# Patient Record
Sex: Male | Born: 1960
Health system: Southern US, Community
[De-identification: ages and names within clinical notes are randomized; demographics above are authoritative.]

## PROBLEM LIST (undated history)

## (undated) HISTORY — PX: NO PAST SURGERIES: SHX2092

---

## 2007-06-06 DIAGNOSIS — R972 Elevated prostate specific antigen [PSA]: Secondary | ICD-10-CM | POA: Insufficient documentation

## 2011-04-07 ENCOUNTER — Ambulatory Visit: Payer: Self-pay | Admitting: Family Medicine

## 2013-01-30 ENCOUNTER — Ambulatory Visit: Payer: Self-pay | Admitting: Gastroenterology

## 2013-01-30 LAB — HM COLONOSCOPY

## 2013-04-22 ENCOUNTER — Ambulatory Visit: Payer: Self-pay | Admitting: Urology

## 2015-01-21 ENCOUNTER — Encounter: Payer: Self-pay | Admitting: Family Medicine

## 2015-05-19 ENCOUNTER — Encounter: Payer: Self-pay | Admitting: Family Medicine

## 2015-10-29 ENCOUNTER — Encounter: Payer: Self-pay | Admitting: Family Medicine

## 2015-11-01 ENCOUNTER — Encounter: Payer: Self-pay | Admitting: Family Medicine

## 2016-10-24 LAB — CBC AND DIFFERENTIAL
HEMATOCRIT: 46 % (ref 41–53)
Hemoglobin: 15.3 g/dL (ref 13.5–17.5)
Neutrophils Absolute: 47 /uL
PLATELETS: 202 10*3/uL (ref 150–399)
WBC: 3.3 10^3/mL

## 2016-10-24 LAB — LIPID PANEL
CHOLESTEROL: 175 mg/dL (ref 0–200)
HDL: 44 mg/dL (ref 35–70)
LDL CALC: 112 mg/dL
Triglycerides: 96 mg/dL (ref 40–160)

## 2016-10-24 LAB — BASIC METABOLIC PANEL
BUN: 14 mg/dL (ref 4–21)
Glucose: 81 mg/dL
SODIUM: 139 mmol/L (ref 137–147)

## 2016-10-24 LAB — TSH: TSH: 5.3 u[IU]/mL (ref 0.41–5.90)

## 2016-10-24 LAB — HEPATIC FUNCTION PANEL
ALT: 21 U/L (ref 10–40)
AST: 33 U/L (ref 14–40)
Alkaline Phosphatase: 70 U/L (ref 25–125)
Bilirubin, Total: 0.4 mg/dL

## 2016-10-24 LAB — VITAMIN D 25 HYDROXY (VIT D DEFICIENCY, FRACTURES): VIT D 25 HYDROXY: 36.8

## 2016-10-24 LAB — PSA: PSA: 3.3

## 2016-10-24 LAB — HEMOGLOBIN A1C: Hemoglobin A1C: 5.6

## 2016-10-31 ENCOUNTER — Other Ambulatory Visit: Payer: Self-pay

## 2016-10-31 DIAGNOSIS — E291 Testicular hypofunction: Secondary | ICD-10-CM | POA: Insufficient documentation

## 2016-10-31 DIAGNOSIS — N419 Inflammatory disease of prostate, unspecified: Secondary | ICD-10-CM | POA: Insufficient documentation

## 2016-10-31 DIAGNOSIS — N529 Male erectile dysfunction, unspecified: Secondary | ICD-10-CM | POA: Insufficient documentation

## 2016-10-31 DIAGNOSIS — E559 Vitamin D deficiency, unspecified: Secondary | ICD-10-CM | POA: Insufficient documentation

## 2016-10-31 DIAGNOSIS — E785 Hyperlipidemia, unspecified: Secondary | ICD-10-CM | POA: Insufficient documentation

## 2016-10-31 DIAGNOSIS — R7309 Other abnormal glucose: Secondary | ICD-10-CM | POA: Insufficient documentation

## 2016-11-06 ENCOUNTER — Encounter: Payer: Self-pay | Admitting: Family Medicine

## 2016-11-06 ENCOUNTER — Ambulatory Visit (INDEPENDENT_AMBULATORY_CARE_PROVIDER_SITE_OTHER): Payer: BLUE CROSS/BLUE SHIELD | Admitting: Family Medicine

## 2016-11-06 VITALS — BP 130/72 | HR 56 | Temp 98.1°F | Ht 72.0 in | Wt 181.0 lb

## 2016-11-06 DIAGNOSIS — E559 Vitamin D deficiency, unspecified: Secondary | ICD-10-CM

## 2016-11-06 DIAGNOSIS — Z0000 Encounter for general adult medical examination without abnormal findings: Secondary | ICD-10-CM | POA: Diagnosis not present

## 2016-11-06 NOTE — Progress Notes (Signed)
Patient: Christopher Wojtaszek., Male    DOB: 10-29-1960, 56 y.o.   MRN: [id number] Visit Date: 11/06/2016  Today's Provider: Dortha Kern, PA   Chief Complaint  Patient presents with  . Annual Exam   Subjective:    Annual physical exam Christopher Cuevas is a 56 y.o. male who presents today for health maintenance and complete physical. He feels well. He reports exercising doing yard work cutting trees and mulching. He reports he is sleeping fair.  ----------------------------------------------------------------- Tdap: 2011 per patient he will get his employer to fax confirmation to [place] Colonoscopy: 01/30/2013  Review of Systems  Constitutional: Negative.   HENT: Negative.   Eyes: Negative.   Respiratory: Negative.   Cardiovascular: Negative.   Gastrointestinal: Negative.   Endocrine: Negative.   Genitourinary: Negative.   Musculoskeletal: Negative.   Skin: Negative.   Allergic/Immunologic: Negative.   Neurological: Negative.   Hematological: Negative.   Psychiatric/Behavioral: Negative.     Social History      He  reports that he has never smoked. He has never used smokeless tobacco. He reports that he drinks alcohol. He reports that he does not use drugs.       Social History   Social History  . Marital status: Divorced since 2008    Spouse name: N/A  . Number of children: N/A  . Years of education: N/A   Social History Main Topics  . Smoking status: Never Smoker  . Smokeless tobacco: Never Used  . Alcohol use Yes     Comment: 1-2 glasses of wine per 2 weeks  . Drug use: No  . Sexual activity: Not Asked   Other Topics Concern  . None   Social History Narrative  . None    No past medical history on file.   Patient Active Problem List   Diagnosis Date Noted  . Abnormal glucose level 10/31/2016  . ED (erectile dysfunction) of organic origin 10/31/2016  . HLD (hyperlipidemia) 10/31/2016  . Male hypogonadism 10/31/2016  . Prostatitis  10/31/2016  . Avitaminosis D 10/31/2016  . Elevated prostate specific antigen (PSA) 06/06/2007    Past Surgical History:  Procedure Laterality Date  . NO PAST SURGERIES     Family History        Family Status  Relation Status  . Mother Deceased at age 49  . Father Deceased at age 57  . Sister Alive  . Brother Alive  . Maternal Grandmother Deceased  . Maternal Grandfather Deceased  . Paternal Grandmother Deceased  . Sister Alive  . Sister Alive  . Sister Alive        His family history includes Asthma in his mother; Stomach cancer in his maternal grandmother.     No Known Allergies   Current Outpatient Prescriptions:  .  Calcium Polycarbophil (FIBER-CAPS PO), Take by mouth., Disp: , Rfl:  .  Cholecalciferol (VITAMIN D3) 1000 units CAPS, Take by mouth., Disp: , Rfl:  .  Omega-3 Fatty Acids (FISH OIL) 1200 MG CAPS, Take by mouth., Disp: , Rfl:    Patient Care Team: Tamsen Roers, PA as PCP - General (Family Medicine)      Objective:   Vitals: BP 130/72 (BP Location: Right Arm, Patient Position: Sitting, Cuff Size: Normal)   Pulse (!) 56   Temp 98.1 F (36.7 C) (Oral)   Ht 6' (1.829 m)   Wt 181 lb (82.1 kg)   BMI 24.55 kg/m    Vitals:  11/06/16 0848  BP: 130/72  Pulse: (!) 56  Temp: 98.1 F (36.7 C)  TempSrc: Oral  Weight: 181 lb (82.1 kg)  Height: 6' (1.829 m)    Physical Exam  Constitutional: He is oriented to person, place, and time. He appears well-developed and well-nourished.  HENT:  Head: Normocephalic and atraumatic.  Right Ear: External ear normal.  Left Ear: External ear normal.  Nose: Nose normal.  Mouth/Throat: Oropharynx is clear and moist.  Eyes: Conjunctivae and EOM are normal. Pupils are equal, round, and reactive to light. Right eye exhibits no discharge.  Neck: Normal range of motion. Neck supple. No tracheal deviation present. No thyromegaly present.  Cardiovascular: Normal rate, regular rhythm, normal heart sounds and intact  distal pulses.   No murmur heard. Pulmonary/Chest: Effort normal and breath sounds normal. No respiratory distress. He has no wheezes. He has no rales. He exhibits no tenderness.  Abdominal: Soft. He exhibits no distension and no mass. There is no tenderness. There is no rebound and no guarding.  Genitourinary: Prostate normal and penis normal. Rectal exam shows guaiac negative stool.  Musculoskeletal: Normal range of motion. He exhibits no edema or tenderness.  Lymphadenopathy:    He has no cervical adenopathy.  Neurological: He is alert and oriented to person, place, and time. He has normal reflexes. No cranial nerve deficit. He exhibits normal muscle tone. Coordination normal.  Skin: Skin is warm and dry. No rash noted. No erythema.  Psychiatric: He has a normal mood and affect. His behavior is normal. Judgment and thought content normal.    Depression Screen PHQ 2/9 Scores 11/06/2016  PHQ - 2 Score 0  PHQ- 9 Score 0   Assessment & Plan:     Routine Health Maintenance and Physical Exam  Exercise Activities and Dietary recommendations Goals    None      Immunization History  Administered Date(s) Administered  . Zoster 11/14/2012    Health Maintenance  Topic Date Due  . Hepatitis C Screening  February 10, 1961  . HIV Screening  11/05/1977  . TETANUS/TDAP  11/05/1981  . INFLUENZA VACCINE  02/14/2017  . COLONOSCOPY  01/31/2023     Discussed health benefits of physical activity, and encouraged him to engage in regular exercise appropriate for his age and condition.    --------------------------------------------------------------------  1. Annual physical exam Good general health. States last tetanus booster was in 2011 and had colonoscopy in 01-30-13. Old records in [place] showed Zostavax was given 11-14-12. Labs from 10-24-16 showed all tests normal with PSA 3.3. Given anticipatory guidance and he will request copy of last tetanus booster record. States he has had Hep C and HIV  blood tests. Will get report from place of employment.   2. Avitaminosis D Vitamin-D level was 36.8 on 10-24-16. Continues to take 1000 IU qd.   Dortha Kern, PA  [facility]

## 2016-11-06 NOTE — Patient Instructions (Signed)
 Health Maintenance, Male A healthy lifestyle and preventive care is important for your health and wellness. Ask your health care provider about what schedule of regular examinations is right for you. What should I know about weight and diet?  Eat a Healthy Diet  Eat plenty of vegetables, fruits, whole grains, low-fat dairy products, and lean protein.  Do not eat a lot of foods high in solid fats, added sugars, or salt. Maintain a Healthy Weight  Regular exercise can help you achieve or maintain a healthy weight. You should:  Do at least 150 minutes of exercise each week. The exercise should increase your heart rate and make you sweat (moderate-intensity exercise).  Do strength-training exercises at least twice a week. Watch Your Levels of Cholesterol and Blood Lipids  Have your blood tested for lipids and cholesterol every 5 years starting at 54 years of age. If you are at high risk for heart disease, you should start having your blood tested when you are 54 years old. You may need to have your cholesterol levels checked more often if:  Your lipid or cholesterol levels are high.  You are older than 54 years of age.  You are at high risk for heart disease. What should I know about cancer screening? Many types of cancers can be detected early and may often be prevented. Lung Cancer  You should be screened every year for lung cancer if:  You are a current smoker who has smoked for at least 30 years.  You are a former smoker who has quit within the past 15 years.  Talk to your health care provider about your screening options, when you should start screening, and how often you should be screened. Colorectal Cancer  Routine colorectal cancer screening usually begins at 54 years of age and should be repeated every 5-10 years until you are 54 years old. You may need to be screened more often if early forms of precancerous polyps or small growths are found. Your health care provider  may recommend screening at an earlier age if you have risk factors for colon cancer.  Your health care provider may recommend using home test kits to check for hidden blood in the stool.  A small camera at the end of a tube can be used to examine your colon (sigmoidoscopy or colonoscopy). This checks for the earliest forms of colorectal cancer. Prostate and Testicular Cancer  Depending on your age and overall health, your health care provider may do certain tests to screen for prostate and testicular cancer.  Talk to your health care provider about any symptoms or concerns you have about testicular or prostate cancer. Skin Cancer  Check your skin from head to toe regularly.  Tell your health care provider about any new moles or changes in moles, especially if:  There is a change in a mole's size, shape, or color.  You have a mole that is larger than a pencil eraser.  Always use sunscreen. Apply sunscreen liberally and repeat throughout the day.  Protect yourself by wearing long sleeves, pants, a wide-brimmed hat, and sunglasses when outside. What should I know about heart disease, diabetes, and high blood pressure?  If you are 18-39 years of age, have your blood pressure checked every 3-5 years. If you are 40 years of age or older, have your blood pressure checked every year. You should have your blood pressure measured twice-once when you are at a hospital or clinic, and once when you are not at   a hospital or clinic. Record the average of the two measurements. To check your blood pressure when you are not at a hospital or clinic, you can use:  An automated blood pressure machine at a pharmacy.  A home blood pressure monitor.  Talk to your health care provider about your target blood pressure.  If you are between 45-79 years old, ask your health care provider if you should take aspirin to prevent heart disease.  Have regular diabetes screenings by checking your fasting blood sugar  level.  If you are at a normal weight and have a low risk for diabetes, have this test once every three years after the age of 45.  If you are overweight and have a high risk for diabetes, consider being tested at a younger age or more often.  A one-time screening for abdominal aortic aneurysm (AAA) by ultrasound is recommended for men aged 65-75 years who are current or former smokers. What should I know about preventing infection? Hepatitis B  If you have a higher risk for hepatitis B, you should be screened for this virus. Talk with your health care provider to find out if you are at risk for hepatitis B infection. Hepatitis C  Blood testing is recommended for:  Everyone born from 1945 through 1965.  Anyone with known risk factors for hepatitis C. Sexually Transmitted Diseases (STDs)  You should be screened each year for STDs including gonorrhea and chlamydia if:  You are sexually active and are younger than 54 years of age.  You are older than 54 years of age and your health care provider tells you that you are at risk for this type of infection.  Your sexual activity has changed since you were last screened and you are at an increased risk for chlamydia or gonorrhea. Ask your health care provider if you are at risk.  Talk with your health care provider about whether you are at high risk of being infected with HIV. Your health care provider may recommend a prescription medicine to help prevent HIV infection. What else can I do?  Schedule regular health, dental, and eye exams.  Stay current with your vaccines (immunizations).  Do not use any tobacco products, such as cigarettes, chewing tobacco, and e-cigarettes. If you need help quitting, ask your health care provider.  Limit alcohol intake to no more than 2 drinks per day. One drink equals 12 ounces of beer, 5 ounces of wine, or 1 ounces of hard liquor.  Do not use street drugs.  Do not share needles.  Ask your health  care provider for help if you need support or information about quitting drugs.  Tell your health care provider if you often feel depressed.  Tell your health care provider if you have ever been abused or do not feel safe at home. This information is not intended to replace advice given to you by your health care provider. Make sure you discuss any questions you have with your health care provider. Document Released: 12/30/2007 Document Revised: 03/01/2016 Document Reviewed: 04/06/2015 Elsevier Interactive Patient Education  2017 [employer]  

## 2016-11-08 ENCOUNTER — Encounter: Payer: Self-pay | Admitting: Family Medicine

## 2017-10-30 LAB — LIPID PANEL
CHOLESTEROL: 206 — AB (ref 0–200)
HDL: 49 (ref 35–70)
LDL Cholesterol: 141
Triglycerides: 82 (ref 40–160)

## 2017-10-30 LAB — CBC AND DIFFERENTIAL
HCT: 45 (ref 41–53)
Hemoglobin: 15.4 (ref 13.5–17.5)
NEUTROS ABS: 2
PLATELETS: 207 (ref 150–399)
WBC: 3.6

## 2017-10-30 LAB — HEPATIC FUNCTION PANEL
ALT: 24 (ref 10–40)
AST: 30 (ref 14–40)
Alkaline Phosphatase: 71 (ref 25–125)
Bilirubin, Total: 0.2

## 2017-10-30 LAB — BASIC METABOLIC PANEL
BUN: 11 (ref 4–21)
Creatinine: 1.1 (ref 0.6–1.3)
Glucose: 94
POTASSIUM: 4.5 (ref 3.4–5.3)
Sodium: 144 (ref 137–147)

## 2017-10-30 LAB — PSA: PSA: 4.7

## 2017-10-30 LAB — VITAMIN D 25 HYDROXY (VIT D DEFICIENCY, FRACTURES): VIT D 25 HYDROXY: 31.5

## 2017-10-30 LAB — TSH: TSH: 4.8 (ref 0.41–5.90)

## 2017-10-30 LAB — IRON,TIBC AND FERRITIN PANEL: IRON: 67

## 2017-10-30 LAB — HEMOGLOBIN A1C: Hemoglobin A1C: 5.8

## 2017-11-09 ENCOUNTER — Encounter: Payer: Self-pay | Admitting: Family Medicine

## 2017-11-09 ENCOUNTER — Ambulatory Visit (INDEPENDENT_AMBULATORY_CARE_PROVIDER_SITE_OTHER): Payer: BLUE CROSS/BLUE SHIELD | Admitting: Family Medicine

## 2017-11-09 VITALS — BP 118/68 | HR 69 | Temp 98.9°F | Ht 72.0 in | Wt 179.4 lb

## 2017-11-09 DIAGNOSIS — E7800 Pure hypercholesterolemia, unspecified: Secondary | ICD-10-CM | POA: Diagnosis not present

## 2017-11-09 DIAGNOSIS — Z0000 Encounter for general adult medical examination without abnormal findings: Secondary | ICD-10-CM

## 2017-11-09 DIAGNOSIS — Z1159 Encounter for screening for other viral diseases: Secondary | ICD-10-CM

## 2017-11-09 DIAGNOSIS — Z114 Encounter for screening for human immunodeficiency virus [HIV]: Secondary | ICD-10-CM | POA: Diagnosis not present

## 2017-11-09 NOTE — Progress Notes (Signed)
Patient: Christopher Cuevas., Male    DOB: 08/13/1960, 57 y.o.   MRN: [id number] Visit Date: 11/09/2017  Today's Provider: Dortha Kern, PA   Chief Complaint  Patient presents with  . Annual Exam   Subjective:    Annual physical exam [name]. is a 57 y.o. male who presents today for health maintenance and complete physical. He feels well. He reports not exercising regularly, just yard work and strenuous work at job. He reports he is sleeping well.  -----------------------------------------------------------------   Review of Systems  Constitutional: Negative.   HENT: Negative.   Eyes: Negative.   Respiratory: Negative.   Cardiovascular: Negative.   Gastrointestinal: Negative.   Endocrine: Negative.   Genitourinary: Negative.   Musculoskeletal: Negative.   Skin: Negative.   Allergic/Immunologic: Negative.   Neurological: Negative.   Hematological: Negative.   Psychiatric/Behavioral: Negative.     Social History      He  reports that he has never smoked. He has never used smokeless tobacco. He reports that he drinks alcohol. He reports that he does not use drugs.       Social History   Socioeconomic History  . Marital status: Single    Spouse name: Not on file  . Number of children: Not on file  . Years of education: Not on file  . Highest education level: Not on file  Occupational History  . Not on file  Social Needs  . Financial resource strain: Not on file  . Food insecurity:    Worry: Not on file    Inability: Not on file  . Transportation needs:    Medical: Not on file    Non-medical: Not on file  Tobacco Use  . Smoking status: Never Smoker  . Smokeless tobacco: Never Used  Substance and Sexual Activity  . Alcohol use: Yes    Comment: 1-2 glasses of wine per 2 weeks  . Drug use: No  . Sexual activity: Not on file  Lifestyle  . Physical activity:    Days per week: Not on file    Minutes per session: Not on file  . Stress:  Not on file  Relationships  . Social connections:    Talks on phone: Not on file    Gets together: Not on file    Attends religious service: Not on file    Active member of club or organization: Not on file    Attends meetings of clubs or organizations: Not on file    Relationship status: Not on file  Other Topics Concern  . Not on file  Social History Narrative  . Not on file   Patient Active Problem List   Diagnosis Date Noted  . Abnormal glucose level 10/31/2016  . ED (erectile dysfunction) of organic origin 10/31/2016  . HLD (hyperlipidemia) 10/31/2016  . Male hypogonadism 10/31/2016  . Prostatitis 10/31/2016  . Avitaminosis D 10/31/2016  . Elevated prostate specific antigen (PSA) 06/06/2007   Past Surgical History:  Procedure Laterality Date  . NO PAST SURGERIES     Family History        Family Status  Relation Name Status  . Mother  Deceased at age 6  . Father  Deceased at age 37  . Sister  Alive  . Brother  Alive  . MGM  Deceased  . MGF  Deceased  . PGM  Deceased  . Sister  Alive  . Sister  Alive  . Sister  Alive  His family history includes Asthma in his mother; Stomach cancer in his maternal grandmother.     No Known Allergies  Current Outpatient Medications:  .  Calcium Polycarbophil (FIBER-CAPS PO), Take by mouth., Disp: , Rfl:  .  Cholecalciferol (VITAMIN D3) 1000 units CAPS, Take by mouth., Disp: , Rfl:  .  Omega-3 Fatty Acids (FISH OIL) 1200 MG CAPS, Take by mouth., Disp: , Rfl:  .  OVER THE COUNTER MEDICATION, , Disp: , Rfl:  .  Red Yeast Rice 600 MG CAPS, Take 2 capsules by mouth daily., Disp: , Rfl:    Patient Care Team: Ravin Bendall, Jodell Cipro, PA as PCP - General (Family Medicine)     Objective:   Vitals: BP 118/68 (BP Location: Right Arm, Patient Position: Sitting, Cuff Size: Normal)   Pulse 69   Temp 98.9 F (37.2 C) (Oral)   Ht 6' (1.829 m)   Wt 179 lb 6.4 oz (81.4 kg)   SpO2 99%   BMI 24.33 kg/m  Wt Readings from Last 3  Encounters:  11/09/17 179 lb 6.4 oz (81.4 kg)  11/06/16 181 lb (82.1 kg)    Physical Exam  Constitutional: He is oriented to person, place, and time. He appears well-developed and well-nourished.  HENT:  Head: Normocephalic and atraumatic.  Right Ear: External ear normal.  Left Ear: External ear normal.  Nose: Nose normal.  Mouth/Throat: Oropharynx is clear and moist.  Eyes: Pupils are equal, round, and reactive to light. Conjunctivae and EOM are normal. Right eye exhibits no discharge.  Neck: Normal range of motion. Neck supple. No tracheal deviation present. No thyromegaly present.  Cardiovascular: Normal rate, regular rhythm, normal heart sounds and intact distal pulses.  No murmur heard. Pulmonary/Chest: Effort normal and breath sounds normal. No respiratory distress. He has no wheezes. He has no rales. He exhibits no tenderness.  Abdominal: Soft. He exhibits no distension and no mass. There is no tenderness. There is no rebound and no guarding.  Genitourinary: Rectum normal, prostate normal and penis normal. Rectal exam shows guaiac negative stool.  Musculoskeletal: Normal range of motion. He exhibits no edema or tenderness.  Lymphadenopathy:    He has no cervical adenopathy.  Neurological: He is alert and oriented to person, place, and time. He has normal reflexes. He displays normal reflexes. No cranial nerve deficit. He exhibits normal muscle tone. Coordination normal.  Skin: Skin is warm and dry. No rash noted. No erythema.  Psychiatric: He has a normal mood and affect. His behavior is normal. Judgment and thought content normal.    Depression Screen PHQ 2/9 Scores 11/09/2017 11/06/2016  PHQ - 2 Score 0 0  PHQ- 9 Score 0 0    Assessment & Plan:     Routine Health Maintenance and Physical Exam  Exercise Activities and Dietary recommendations Goals    Recommend exercise for 30 minutes 3-4 days a week (job is very physical).      Immunization History  Administered  Date(s) Administered  . Tdap 10/20/2015  . Zoster 11/14/2012    Health Maintenance  Topic Date Due  . Hepatitis C Screening  [birth year]  . HIV Screening  11/05/1977  . INFLUENZA VACCINE  02/14/2018  . COLONOSCOPY  01/31/2023  . TETANUS/TDAP  10/19/2025    Discussed health benefits of physical activity, and encouraged him to engage in regular exercise appropriate for his age and condition.    -------------------------------------------------------------------- 1. Annual physical exam Good general health. Immunizations up to date and has had a normal colonoscopy  in 2014. Labs done at work shows some elevation of LDL and borderline PSA. Denies frequency, nocturia or hesitancy of urine flow. Encouraged to exercise regularly. Given anticipatory guidance. Recheck annually. Recent Results (from the past 2160 hour(s))  Iron, TIBC and Ferritin Panel     Status: None   Collection Time: 10/30/17 12:00 AM  Result Value Ref Range   Iron 67   CBC and differential     Status: None   Collection Time: 10/30/17 12:00 AM  Result Value Ref Range   Hemoglobin 15.4 13.5 - 17.5   HCT 45 41 - 53   Neutrophils Absolute 2    Platelets 207 150 - 399   WBC 3.6   VITAMIN D 25 Hydroxy (Vit-D Deficiency, Fractures)     Status: None   Collection Time: 10/30/17 12:00 AM  Result Value Ref Range   Vit D, 25-Hydroxy 31.5   Basic metabolic panel     Status: None   Collection Time: 10/30/17 12:00 AM  Result Value Ref Range   Glucose 94    BUN 11 4 - 21   Creatinine 1.1 0.6 - 1.3   Potassium 4.5 3.4 - 5.3   Sodium 144 137 - 147  Lipid panel     Status: Abnormal   Collection Time: 10/30/17 12:00 AM  Result Value Ref Range   Triglycerides 82 40 - 160   Cholesterol 206 (A) 0 - 200   HDL 49 35 - 70   LDL Cholesterol 141   Hepatic function panel     Status: None   Collection Time: 10/30/17 12:00 AM  Result Value Ref Range   Alkaline Phosphatase 71 25 - 125   ALT 24 10 - 40   AST 30 14 - 40   Bilirubin,  Total 0.2   Hemoglobin A1c     Status: None   Collection Time: 10/30/17 12:00 AM  Result Value Ref Range   Hemoglobin A1C 5.8   PSA     Status: None   Collection Time: 10/30/17 12:00 AM  Result Value Ref Range   PSA 4.7   TSH     Status: None   Collection Time: 10/30/17 12:00 AM  Result Value Ref Range   TSH 4.80 0.41 - 5.90   2. Screening for HIV (human immunodeficiency virus) States this blood test was done at his place of employment. Will get date and test results to scan into chart.  3. Need for hepatitis C screening test States this blood test was done at his place of employment. Will get date and test results to scan into chart.  4. Elevated LDL cholesterol level Still taking the Red Yeast Rice 600 mg qd with Omega-3 Fatty Acid 1200 mg qd. Trying to follow a low fat diet. Encouraged to exercise for 30 minutes 3-4 days a week. Lipid Panel     Component Value Date/Time   CHOL 206 (A) 10/30/2017   TRIG 82 10/30/2017   HDL 49 10/30/2017   LDLCALC 141 10/30/2017       Dortha Kernennis Gwendolyne Welford, PA  [facility]

## 2018-01-10 LAB — HM HEPATITIS C SCREENING LAB: HM Hepatitis Screen: NEGATIVE

## 2018-01-10 LAB — HM HIV SCREENING LAB: HM HIV Screening: NEGATIVE

## 2019-04-18 LAB — LIPID PANEL
Chol/HDL Ratio: 5.2
Cholesterol: 224 — AB (ref 0–200)
Estimated CHD Risk: 1.1
HDL: 43 (ref 35–70)
LDL Cholesterol: 159
Triglycerides: 119 (ref 40–160)
VLDL Cholesterol Cal: 22

## 2019-04-18 LAB — CMP12+LP+TP+TSH+6AC+PSA+CBC?
Albumin: 4.5
BUN/Creatinine Ratio: 10
EGFR (Non-African Amer.): 71
GGT: 55
Phosphorus: 3.2
Total Protein: 7 g/dL

## 2019-04-18 LAB — CBC WITH DIFFERENTIAL/PLATELET
BASO%: 2 %
BASO(ABSOLUTE): 0.1
EOS (ABSOLUTE): 0.2
EOS%: 6 %
Grans (Absolute): 0
Grans: 1
LYMPH%: 32 %
Lymphs Abs: 1.1
MCH: 31.7 pg (ref 26.5–32.5)
MCHC: 35.2 g/dL (ref 32.5–36.9)
MCV: 90 fL (ref 76.0–111.0)
Monocyes absolute: 0.3 10*3/uL (ref 0.1–1)
Monocyte %: 10
Neutro Abs: 1.7
Neutrophil %: 49
Platelet: 198
RBC: 5.05 (ref 3.87–5.11)
RDW: 12.4 % (ref 11.6–14)

## 2019-04-18 LAB — HEPATIC FUNCTION PANEL
ALT: 35 (ref 10–40)
AST: 42 — AB (ref 14–40)
Alkaline Phosphatase: 78 (ref 25–125)
Bilirubin, Total: 0.4

## 2019-04-18 LAB — CMP12+LP+TP+TSH+6AC+PSA+CBC…
Albumin/Globulin Ratio: 1.8
Calcium: 9.7
Chloride, Serum: 104
EGFR (African American): 82
Globulin, Total: 2.5
LDH: 222
Uric Acid: 8.3

## 2019-04-18 LAB — CBC AND DIFFERENTIAL
HCT: 45 (ref 41–53)
Hemoglobin: 16 (ref 13.5–17.5)
WBC: 3.4

## 2019-04-18 LAB — BASIC METABOLIC PANEL
BUN: 11 (ref 4–21)
Creatinine: 1.2 (ref 0.6–1.3)
Glucose: 99
Potassium: 4.6 (ref 3.4–5.3)
Sodium: 140 (ref 137–147)

## 2019-04-18 LAB — TSH
Free Thyroxine Index: 1.4
T3 Uptake: 27
T4: 5.2
TSH: 2.58 (ref 0.41–5.90)

## 2019-04-18 LAB — VITAMIN D 25 HYDROXY (VIT D DEFICIENCY, FRACTURES): Vit D, 25-Hydroxy: 29.1

## 2019-04-18 LAB — PSA: PSA: 3.3

## 2019-04-18 LAB — IRON,TIBC AND FERRITIN PANEL: Iron: 106

## 2019-04-18 LAB — HEMOGLOBIN A1C: Hemoglobin A1C: 5.6

## 2019-05-07 ENCOUNTER — Encounter: Payer: Self-pay | Admitting: *Deleted

## 2019-11-25 DIAGNOSIS — Z0189 Encounter for other specified special examinations: Secondary | ICD-10-CM | POA: Diagnosis not present

## 2019-11-27 DIAGNOSIS — Z0131 Encounter for examination of blood pressure with abnormal findings: Secondary | ICD-10-CM | POA: Diagnosis not present

## 2019-11-27 DIAGNOSIS — Z713 Dietary counseling and surveillance: Secondary | ICD-10-CM | POA: Diagnosis not present

## 2019-11-27 DIAGNOSIS — Z043 Encounter for examination and observation following other accident: Secondary | ICD-10-CM | POA: Diagnosis not present

## 2020-02-18 DIAGNOSIS — Z0189 Encounter for other specified special examinations: Secondary | ICD-10-CM | POA: Diagnosis not present

## 2020-03-01 DIAGNOSIS — R972 Elevated prostate specific antigen [PSA]: Secondary | ICD-10-CM | POA: Diagnosis not present

## 2020-03-02 DIAGNOSIS — R310 Gross hematuria: Secondary | ICD-10-CM | POA: Diagnosis not present

## 2020-03-02 DIAGNOSIS — R361 Hematospermia: Secondary | ICD-10-CM | POA: Diagnosis not present

## 2020-03-02 DIAGNOSIS — N401 Enlarged prostate with lower urinary tract symptoms: Secondary | ICD-10-CM | POA: Diagnosis not present

## 2020-03-02 DIAGNOSIS — Z125 Encounter for screening for malignant neoplasm of prostate: Secondary | ICD-10-CM | POA: Diagnosis not present

## 2020-03-04 ENCOUNTER — Other Ambulatory Visit: Payer: Self-pay | Admitting: Urology

## 2020-03-04 DIAGNOSIS — R310 Gross hematuria: Secondary | ICD-10-CM

## 2020-03-08 ENCOUNTER — Encounter (INDEPENDENT_AMBULATORY_CARE_PROVIDER_SITE_OTHER): Payer: Self-pay

## 2020-03-08 ENCOUNTER — Other Ambulatory Visit: Payer: Self-pay

## 2020-03-08 ENCOUNTER — Ambulatory Visit
Admission: RE | Admit: 2020-03-08 | Discharge: 2020-03-08 | Disposition: A | Discharge status: Home / Self Care | Payer: BC Managed Care – PPO | Source: Ambulatory Visit | Attending: Urology | Admitting: Urology

## 2020-03-08 DIAGNOSIS — R310 Gross hematuria: Secondary | ICD-10-CM | POA: Insufficient documentation

## 2020-03-08 MED ORDER — IOHEXOL 300 MG/ML  SOLN
125.0000 mL | Freq: Once | INTRAMUSCULAR | Status: AC | PRN
  Administered 2020-03-08: 125 mL via INTRAVENOUS

## 2020-03-16 DIAGNOSIS — R972 Elevated prostate specific antigen [PSA]: Secondary | ICD-10-CM | POA: Diagnosis not present

## 2020-03-16 DIAGNOSIS — N401 Enlarged prostate with lower urinary tract symptoms: Secondary | ICD-10-CM | POA: Diagnosis not present

## 2020-03-16 DIAGNOSIS — R361 Hematospermia: Secondary | ICD-10-CM | POA: Diagnosis not present

## 2020-03-16 DIAGNOSIS — R310 Gross hematuria: Secondary | ICD-10-CM | POA: Diagnosis not present

## 2020-03-17 DIAGNOSIS — Z0189 Encounter for other specified special examinations: Secondary | ICD-10-CM | POA: Diagnosis not present

## 2020-03-18 ENCOUNTER — Other Ambulatory Visit: Payer: Self-pay | Admitting: Orthopedic Surgery

## 2020-03-18 DIAGNOSIS — R972 Elevated prostate specific antigen [PSA]: Secondary | ICD-10-CM

## 2020-04-07 ENCOUNTER — Other Ambulatory Visit: Payer: Self-pay

## 2020-04-07 ENCOUNTER — Ambulatory Visit
Admission: RE | Admit: 2020-04-07 | Discharge: 2020-04-07 | Disposition: A | Discharge status: Home / Self Care | Payer: BC Managed Care – PPO | Source: Ambulatory Visit | Attending: Orthopedic Surgery | Admitting: Orthopedic Surgery

## 2020-04-07 DIAGNOSIS — R972 Elevated prostate specific antigen [PSA]: Secondary | ICD-10-CM | POA: Diagnosis not present

## 2020-04-07 DIAGNOSIS — R590 Localized enlarged lymph nodes: Secondary | ICD-10-CM | POA: Diagnosis not present

## 2020-04-07 DIAGNOSIS — N433 Hydrocele, unspecified: Secondary | ICD-10-CM | POA: Diagnosis not present

## 2020-04-07 MED ORDER — GADOBUTROL 1 MMOL/ML IV SOLN
7.5000 mL | Freq: Once | INTRAVENOUS | Status: AC | PRN
  Administered 2020-04-07: 7.5 mL via INTRAVENOUS

## 2020-04-12 DIAGNOSIS — R972 Elevated prostate specific antigen [PSA]: Secondary | ICD-10-CM | POA: Diagnosis not present

## 2020-04-12 DIAGNOSIS — R310 Gross hematuria: Secondary | ICD-10-CM | POA: Diagnosis not present

## 2020-04-12 DIAGNOSIS — R361 Hematospermia: Secondary | ICD-10-CM | POA: Diagnosis not present

## 2020-04-12 DIAGNOSIS — N401 Enlarged prostate with lower urinary tract symptoms: Secondary | ICD-10-CM | POA: Diagnosis not present

## 2020-10-11 DIAGNOSIS — R361 Hematospermia: Secondary | ICD-10-CM | POA: Diagnosis not present

## 2020-10-11 DIAGNOSIS — N401 Enlarged prostate with lower urinary tract symptoms: Secondary | ICD-10-CM | POA: Diagnosis not present

## 2020-10-11 DIAGNOSIS — R972 Elevated prostate specific antigen [PSA]: Secondary | ICD-10-CM | POA: Diagnosis not present

## 2020-10-18 DIAGNOSIS — Z0189 Encounter for other specified special examinations: Secondary | ICD-10-CM | POA: Diagnosis not present

## 2020-10-18 DIAGNOSIS — R21 Rash and other nonspecific skin eruption: Secondary | ICD-10-CM | POA: Diagnosis not present

## 2020-10-19 DIAGNOSIS — Z043 Encounter for examination and observation following other accident: Secondary | ICD-10-CM | POA: Diagnosis not present

## 2020-10-19 DIAGNOSIS — Z713 Dietary counseling and surveillance: Secondary | ICD-10-CM | POA: Diagnosis not present

## 2020-10-19 DIAGNOSIS — R7303 Prediabetes: Secondary | ICD-10-CM | POA: Diagnosis not present

## 2020-12-16 DIAGNOSIS — Z202 Contact with and (suspected) exposure to infections with a predominantly sexual mode of transmission: Secondary | ICD-10-CM | POA: Diagnosis not present

## 2021-01-25 IMAGING — MR MR PROSTATE WO/W CM
56 series · 56 of 56 positions shown · IV contrast (7.5ml Gadavist)
Comparison: CT abdomen and pelvis from March 08, 2020

CLINICAL DATA: Elevated PSA most recent PSA of 4.5 on 03/17/2020

EXAM:
MR PROSTATE WITHOUT AND WITH CONTRAST
TECHNIQUE: Multiplanar multisequence MRI images were obtained of the pelvis
centered about the prostate. Pre and post contrast images were
obtained.
CONTRAST:  7.5mL GADAVIST GADOBUTROL 1 MMOL/ML IV SOLN

[Series 3: ax in&out whole · axial · 5.0mm · 0.74mm/px · 1 of 70 slices shown]
[im 1/70]
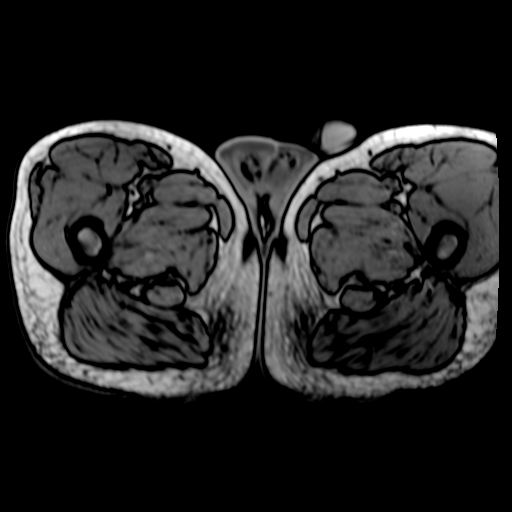

[Series 4: T2 · axial · 3.0mm · 0.56mm/px · 1 of 29 slices shown (1 of 3)]
[im 1/29]
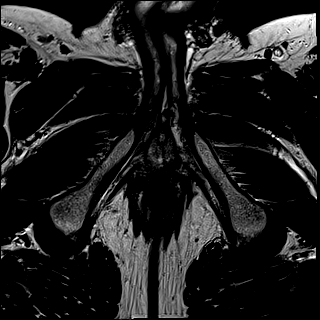

[Series 5: T2 · coronal · 3.0mm · 0.70mm/px · 1 of 35 slices shown (2 of 3)]
[im 1/35]
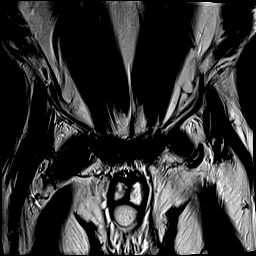

[Series 6: DWI · axial · 3.0mm · 0.86mm/px · 1 of 75 slices shown (1 of 3)]
[im 1/75]
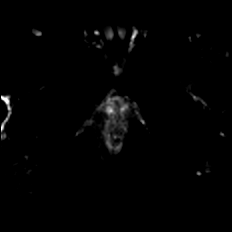

[Series 7: DWI · axial · 3.0mm · 0.86mm/px · 1 of 25 slices shown (2 of 3)]
[im 1/25]
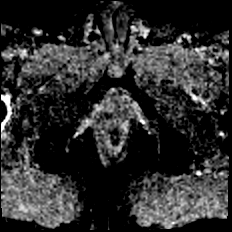

[Series 8: DWI · axial · 3.0mm · 0.86mm/px · 1 of 25 slices shown (3 of 3)]
[im 1/25]
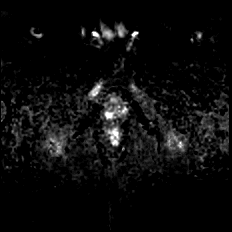

[Series 9: T2 · axial · 1.0mm · 1.04mm/px · 1 of 80 slices shown (3 of 3)]
[im 1/80]
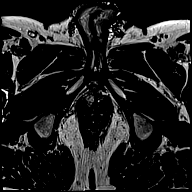

[Series 10: T1 · axial · 3.0mm · 1.15mm/px · 1 of 28 slices shown (1 of 49)]
[im 1/28]
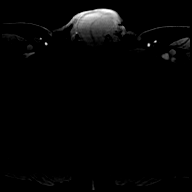

[Series 11: T1 · axial · 3.0mm · 1.15mm/px · 1 of 28 slices shown (2 of 49)]
[im 1/28]
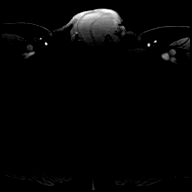

[Series 12: T1 · axial · 3.0mm · 1.15mm/px · 1 of 28 slices shown (3 of 49)]
[im 1/28]
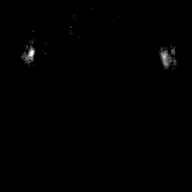

[Series 13: T1 · axial · 3.0mm · 1.15mm/px · 1 of 28 slices shown (4 of 49)]
[im 1/28]
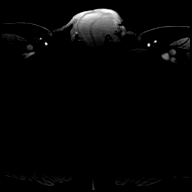

[Series 14: T1 · axial · 3.0mm · 1.15mm/px · 1 of 28 slices shown (5 of 49)]
[im 1/28]
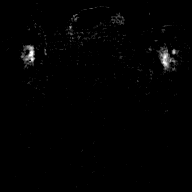

[Series 15: T1 · axial · 3.0mm · 1.15mm/px · 1 of 28 slices shown (6 of 49)]
[im 1/28]
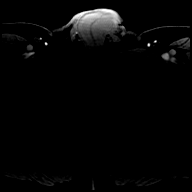

[Series 16: T1 · axial · 3.0mm · 1.15mm/px · 1 of 28 slices shown (7 of 49)]
[im 1/28]
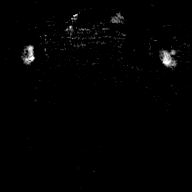

[Series 17: T1 · axial · 3.0mm · 1.15mm/px · 1 of 28 slices shown (8 of 49)]
[im 1/28]
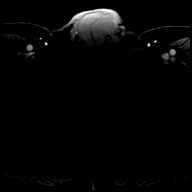

[Series 18: T1 · axial · 3.0mm · 1.15mm/px · 1 of 28 slices shown (9 of 49)]
[im 1/28]
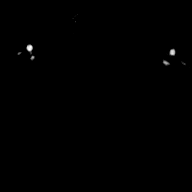

[Series 19: T1 · axial · 3.0mm · 1.15mm/px · 1 of 28 slices shown (10 of 49)]
[im 1/28]
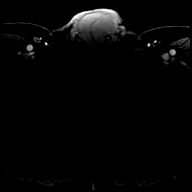

[Series 20: T1 · axial · 3.0mm · 1.15mm/px · 1 of 28 slices shown (11 of 49)]
[im 1/28]
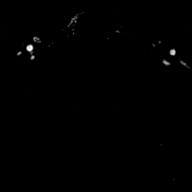

[Series 21: T1 · axial · 3.0mm · 1.15mm/px · 1 of 28 slices shown (12 of 49)]
[im 1/28]
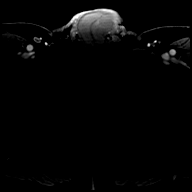

[Series 22: T1 · axial · 3.0mm · 1.15mm/px · 1 of 28 slices shown (13 of 49)]
[im 1/28]
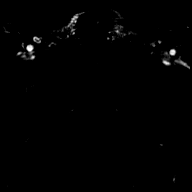

[Series 23: T1 · axial · 3.0mm · 1.15mm/px · 1 of 28 slices shown (14 of 49)]
[im 1/28]
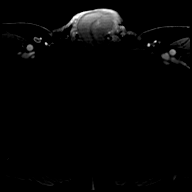

[Series 24: T1 · axial · 3.0mm · 1.15mm/px · 1 of 28 slices shown (15 of 49)]
[im 1/28]
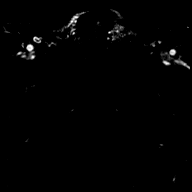

[Series 25: T1 · axial · 3.0mm · 1.15mm/px · 1 of 28 slices shown (16 of 49)]
[im 1/28]
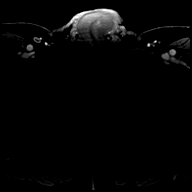

[Series 26: T1 · axial · 3.0mm · 1.15mm/px · 1 of 28 slices shown (17 of 49)]
[im 1/28]
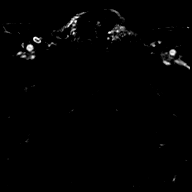

[Series 27: T1 · axial · 3.0mm · 1.15mm/px · 1 of 28 slices shown (18 of 49)]
[im 1/28]
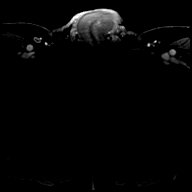

[Series 28: T1 · axial · 3.0mm · 1.15mm/px · 1 of 28 slices shown (19 of 49)]
[im 1/28]
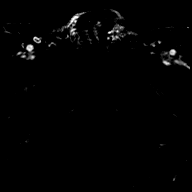

[Series 29: T1 · axial · 3.0mm · 1.15mm/px · 1 of 28 slices shown (20 of 49)]
[im 1/28]
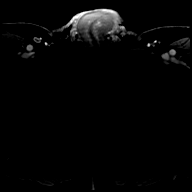

[Series 30: T1 · axial · 3.0mm · 1.15mm/px · 1 of 28 slices shown (21 of 49)]
[im 1/28]
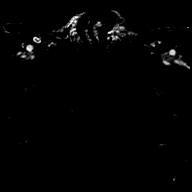

[Series 31: T1 · axial · 3.0mm · 1.15mm/px · 1 of 28 slices shown (22 of 49)]
[im 1/28]
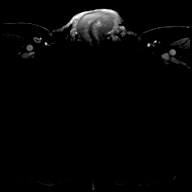

[Series 32: T1 · axial · 3.0mm · 1.15mm/px · 1 of 28 slices shown (23 of 49)]
[im 1/28]
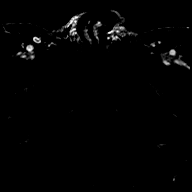

[Series 33: T1 · axial · 3.0mm · 1.15mm/px · 1 of 28 slices shown (24 of 49)]
[im 1/28]
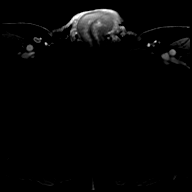

[Series 34: T1 · axial · 3.0mm · 1.15mm/px · 1 of 28 slices shown (25 of 49)]
[im 1/28]
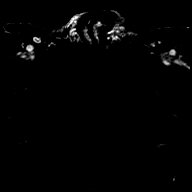

[Series 35: T1 · axial · 3.0mm · 1.15mm/px · 1 of 28 slices shown (26 of 49)]
[im 1/28]
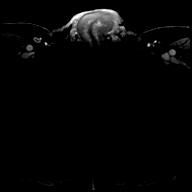

[Series 36: T1 · axial · 3.0mm · 1.15mm/px · 1 of 28 slices shown (27 of 49)]
[im 1/28]
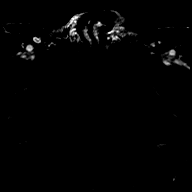

[Series 37: T1 · axial · 3.0mm · 1.15mm/px · 1 of 28 slices shown (28 of 49)]
[im 1/28]
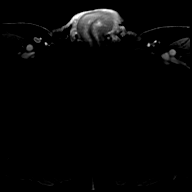

[Series 38: T1 · axial · 3.0mm · 1.15mm/px · 1 of 28 slices shown (29 of 49)]
[im 1/28]
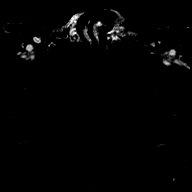

[Series 39: T1 · axial · 3.0mm · 1.15mm/px · 1 of 28 slices shown (30 of 49)]
[im 1/28]
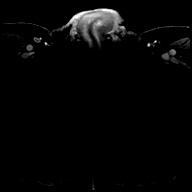

[Series 40: T1 · axial · 3.0mm · 1.15mm/px · 1 of 28 slices shown (31 of 49)]
[im 1/28]
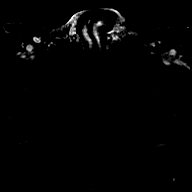

[Series 41: T1 · axial · 3.0mm · 1.15mm/px · 1 of 28 slices shown (32 of 49)]
[im 1/28]
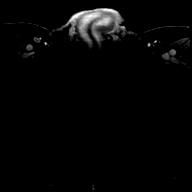

[Series 42: T1 · axial · 3.0mm · 1.15mm/px · 1 of 28 slices shown (33 of 49)]
[im 1/28]
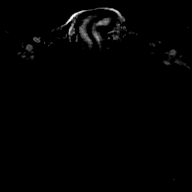

[Series 43: T1 · axial · 3.0mm · 1.15mm/px · 1 of 28 slices shown (34 of 49)]
[im 1/28]
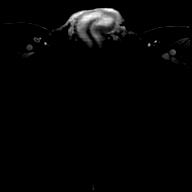

[Series 44: T1 · axial · 3.0mm · 1.15mm/px · 1 of 28 slices shown (35 of 49)]
[im 1/28]
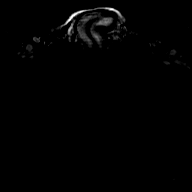

[Series 45: T1 · axial · 3.0mm · 1.15mm/px · 1 of 28 slices shown (36 of 49)]
[im 1/28]
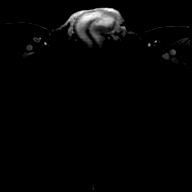

[Series 46: T1 · axial · 3.0mm · 1.15mm/px · 1 of 28 slices shown (37 of 49)]
[im 1/28]
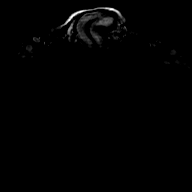

[Series 47: T1 · axial · 3.0mm · 1.15mm/px · 1 of 28 slices shown (38 of 49)]
[im 1/28]
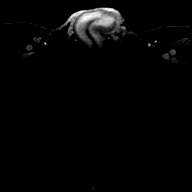

[Series 48: T1 · axial · 3.0mm · 1.15mm/px · 1 of 28 slices shown (39 of 49)]
[im 1/28]
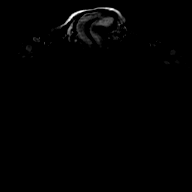

[Series 49: T1 · axial · 3.0mm · 1.15mm/px · 1 of 28 slices shown (40 of 49)]
[im 1/28]
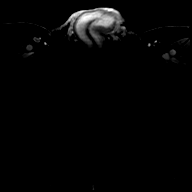

[Series 50: T1 · axial · 3.0mm · 1.15mm/px · 1 of 28 slices shown (41 of 49)]
[im 1/28]
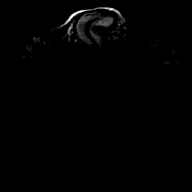

[Series 51: T1 · axial · 3.0mm · 1.15mm/px · 1 of 28 slices shown (42 of 49)]
[im 1/28]
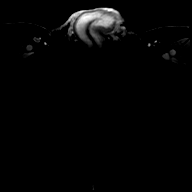

[Series 52: T1 · axial · 3.0mm · 1.15mm/px · 1 of 28 slices shown (43 of 49)]
[im 1/28]
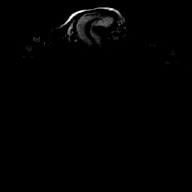

[Series 53: T1 · axial · 3.0mm · 1.15mm/px · 1 of 28 slices shown (44 of 49)]
[im 1/28]
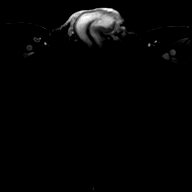

[Series 54: T1 · axial · 3.0mm · 1.15mm/px · 1 of 28 slices shown (45 of 49)]
[im 1/28]
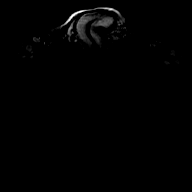

[Series 55: T1 · axial · 3.0mm · 1.15mm/px · 1 of 28 slices shown (46 of 49)]
[im 1/28]
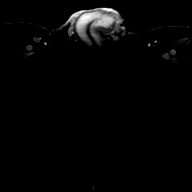

[Series 56: T1 · axial · 3.0mm · 1.15mm/px · 1 of 28 slices shown (47 of 49)]
[im 1/28]
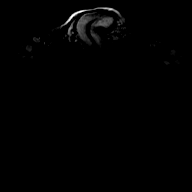

[Series 57: T1 · axial · 3.0mm · 1.15mm/px · 1 of 28 slices shown (48 of 49)]
[im 1/28]
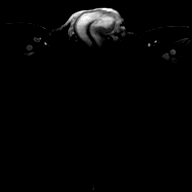

[Series 58: T1 · axial · 3.0mm · 1.15mm/px · 1 of 28 slices shown (49 of 49)]
[im 1/28]
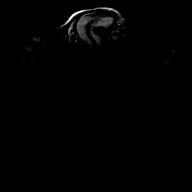

[56 of 56 positions shown; findings below may reference images not displayed]

FINDINGS: Prostate:

Signs of mild BPH and mild heterogeneity of signal throughout the
prostate in general, linear and wedge-shaped areas are noted in the
peripheral zone.

Transitional zone: Changes of mild BPH without high-risk lesion.

Peripheral zone: No focal area of restricted diffusion or abnormal
enhancement with background wedge-shaped and linear areas of T2
hypointensity.

Volume: 5.1 x 3.4 x 4.1 (volume = 37 cc) cm

Transcapsular spread:  Absent

Seminal vesicle involvement: Absent

Neurovascular bundle involvement: Absent

Pelvic adenopathy: Absent

Bone metastasis: Absent

Other findings:

Small bilateral hydroceles.
IMPRESSION: 1. No MRI evidence of high-risk lesion. Overall assessment PIRADS
category 2.

## 2021-04-07 DIAGNOSIS — Z0189 Encounter for other specified special examinations: Secondary | ICD-10-CM | POA: Diagnosis not present

## 2021-04-11 DIAGNOSIS — N401 Enlarged prostate with lower urinary tract symptoms: Secondary | ICD-10-CM | POA: Diagnosis not present

## 2021-04-11 DIAGNOSIS — R972 Elevated prostate specific antigen [PSA]: Secondary | ICD-10-CM | POA: Diagnosis not present

## 2021-11-08 DIAGNOSIS — Z0189 Encounter for other specified special examinations: Secondary | ICD-10-CM | POA: Diagnosis not present

## 2021-11-08 LAB — VITAMIN D 25 HYDROXY (VIT D DEFICIENCY, FRACTURES): Vit D, 25-Hydroxy: 66.1

## 2021-11-08 LAB — CBC AND DIFFERENTIAL
HCT: 44 (ref 41–53)
Hemoglobin: 14.9 (ref 13.5–17.5)
Neutrophils Absolute: 54
Platelets: 201 10*3/uL (ref 150–400)
WBC: 3.3

## 2021-11-08 LAB — LIPID PANEL
Cholesterol: 227 — AB (ref 0–200)
HDL: 49 (ref 35–70)
LDL Cholesterol: 156
Triglycerides: 120 (ref 40–160)

## 2021-11-08 LAB — HEPATIC FUNCTION PANEL
ALT: 31 U/L (ref 10–40)
AST: 41 — AB (ref 14–40)
Alkaline Phosphatase: 84 (ref 25–125)
Bilirubin, Total: 0.4

## 2021-11-08 LAB — BASIC METABOLIC PANEL
BUN: 9 (ref 4–21)
Chloride: 105 (ref 99–108)
Creatinine: 1 (ref 0.6–1.3)
Glucose: 103
Potassium: 4.4 mEq/L (ref 3.5–5.1)
Sodium: 142 (ref 137–147)

## 2021-11-08 LAB — COMPREHENSIVE METABOLIC PANEL
Albumin: 4.4 (ref 3.5–5.0)
Calcium: 9.2 (ref 8.7–10.7)
eGFR: 88

## 2021-11-08 LAB — HEMOGLOBIN A1C: Hemoglobin A1C: 5.7

## 2021-11-08 LAB — IRON,TIBC AND FERRITIN PANEL: Iron: 63

## 2021-11-08 LAB — VITAMIN B12: Vitamin B-12: 334

## 2021-11-08 LAB — TSH: TSH: 3.43 (ref 0.41–5.90)

## 2021-11-08 LAB — CBC: RBC: 4.74 (ref 3.87–5.11)

## 2021-11-11 ENCOUNTER — Encounter: Payer: Self-pay | Admitting: Family Medicine

## 2021-11-11 ENCOUNTER — Ambulatory Visit (INDEPENDENT_AMBULATORY_CARE_PROVIDER_SITE_OTHER): Payer: BC Managed Care – PPO | Admitting: Family Medicine

## 2021-11-11 VITALS — BP 139/81 | HR 65 | Temp 98.5°F | Resp 16 | Ht 72.0 in | Wt 194.0 lb

## 2021-11-11 DIAGNOSIS — E782 Mixed hyperlipidemia: Secondary | ICD-10-CM | POA: Diagnosis not present

## 2021-11-11 DIAGNOSIS — E559 Vitamin D deficiency, unspecified: Secondary | ICD-10-CM

## 2021-11-11 DIAGNOSIS — R972 Elevated prostate specific antigen [PSA]: Secondary | ICD-10-CM | POA: Diagnosis not present

## 2021-11-11 DIAGNOSIS — R7303 Prediabetes: Secondary | ICD-10-CM | POA: Diagnosis not present

## 2021-11-11 DIAGNOSIS — Z043 Encounter for examination and observation following other accident: Secondary | ICD-10-CM | POA: Diagnosis not present

## 2021-11-11 DIAGNOSIS — Z0000 Encounter for general adult medical examination without abnormal findings: Secondary | ICD-10-CM

## 2021-11-11 DIAGNOSIS — R7309 Other abnormal glucose: Secondary | ICD-10-CM

## 2021-11-11 DIAGNOSIS — Z713 Dietary counseling and surveillance: Secondary | ICD-10-CM | POA: Diagnosis not present

## 2021-11-11 NOTE — Progress Notes (Signed)
? ? ?I,Christopher Cuevas,acting as a scribe for Christopher Huh, MD.,have documented all relevant documentation on the behalf of Christopher Huh, MD,as directed by  Christopher Huh, MD while in the presence of Christopher Huh, MD.  ? ? ?Complete physical exam ? ? ?Patient: Christopher Cuevas.   DOB: February 12, 1961   61 y.o. Male  MRN: [id number] ?Visit Date: 11/11/2021 ? ?Today's healthcare provider: Lelon Huh, MD  ? ?Chief Complaint  ?Patient presents with  ? Annual Exam  ? ?Subjective  ?  ?Christopher Cuevas. is a 61 y.o. male who presents today for a complete physical exam.  ?He reports consuming a general diet. The patient has a physically strenuous job, but has no regular exercise apart from work.  He generally feels fairly well. He reports sleeping fairly well. He does not have additional problems to discuss today.  ?HPI  ?Sees Dr. Eliberto Ivory periodically. Had urine test to follow up on elevated PSA  ?States he [facility]  ? ? ?No past medical history on file. ?Past Surgical History:  ?Procedure Laterality Date  ? NO PAST SURGERIES    ? ?Social History  ? ?Socioeconomic History  ? Marital status: Single  ?  Spouse name: Not on file  ? Number of children: Not on file  ? Years of education: Not on file  ? Highest education level: Not on file  ?Occupational History  ? Not on file  ?Tobacco Use  ? Smoking status: Never  ? Smokeless tobacco: Never  ?Substance and Sexual Activity  ? Alcohol use: Yes  ?  Comment: 1-2 glasses of wine per 2 weeks  ? Drug use: No  ? Sexual activity: Not on file  ?Other Topics Concern  ? Not on file  ?Social History Narrative  ? Not on file  ? ?Social Determinants of Health  ? ?Financial Resource Strain: Not on file  ?Food Insecurity: Not on file  ?Transportation Needs: Not on file  ?Physical Activity: Not on file  ?Stress: Not on file  ?Social Connections: Not on file  ?Intimate Partner Violence: Not on file  ? ?Family Status  ?Relation Name Status  ? Mother  Deceased  at age 82  ? Father  Deceased at age 60  ? Sister  Alive  ? Brother  Alive  ? MGM  Deceased  ? MGF  Deceased  ? PGM  Deceased  ? Sister  Alive  ? Sister  Alive  ? Sister  Alive  ? ?Family History  ?Problem Relation Age of Onset  ? Asthma Mother   ? Stomach cancer Maternal Grandmother   ? ?No Known Allergies  ?Patient Care Team: ?Chrismon, Vickki Muff, PA-C (Inactive) as PCP - General (Family Medicine)  ? ?Medications: ?Outpatient Medications Prior to Visit  ?Medication Sig  ? Cholecalciferol (VITAMIN D3) 250 MCG (10000 UT) TABS Take 1 tablet by mouth daily. M-F  ? Krill Oil (OMEGA-3) 500 MG CAPS Take 1 capsule by mouth daily. M-F  ? PSYLLIUM HUSK PO Take 5 capsules by mouth daily. M-F  ? Red Yeast Rice 600 MG CAPS Take 2 capsules by mouth daily. M-F  ? [DISCONTINUED] Calcium Polycarbophil (FIBER-CAPS PO) Take by mouth.  ? [DISCONTINUED] Cholecalciferol (VITAMIN D3) 1000 units CAPS Take by mouth.  ? [DISCONTINUED] Omega-3 Fatty Acids (FISH OIL) 1200 MG CAPS Take by mouth.  ? [DISCONTINUED] OVER THE COUNTER MEDICATION   ? ?No facility-administered medications prior to visit.  ? ? ?Review of  Systems  ?Constitutional:  Negative for appetite change, chills, fatigue and fever.  ?HENT:  Negative for congestion, ear pain, hearing loss, nosebleeds and trouble swallowing.   ?Eyes:  Negative for pain and visual disturbance.  ?Respiratory:  Negative for cough, chest tightness and shortness of breath.   ?Cardiovascular:  Negative for chest pain, palpitations and leg swelling.  ?Gastrointestinal:  Negative for abdominal pain, blood in stool, constipation, diarrhea, nausea and vomiting.  ?Endocrine: Negative for polydipsia, polyphagia and polyuria.  ?Genitourinary:  Negative for dysuria and flank pain.  ?Musculoskeletal:  Negative for arthralgias, back pain, joint swelling, myalgias and neck stiffness.  ?Skin:  Negative for color change, rash and wound.  ?Neurological:  Negative for dizziness, tremors, seizures, speech difficulty,  weakness, light-headedness and headaches.  ?Psychiatric/Behavioral:  Negative for behavioral problems, confusion, decreased concentration, dysphoric mood and sleep disturbance. The patient is not nervous/anxious.   ?All other systems reviewed and are negative. ? ? ? Objective  ? ?  ?BP 139/81 (BP Location: Left Arm, Patient Position: Sitting, Cuff Size: Large)   Pulse 65   Temp 98.5 ?F (36.9 ?C) (Oral)   Resp 16   Ht 6' (1.829 m)   Wt 194 lb (88 kg)   SpO2 97% Comment: room air  BMI 26.31 kg/m?  ? ? ? ?Physical Exam  ? ?General Appearance:     ?Well developed, well nourished male. Alert, cooperative, in no acute distress, appears stated age  ?Head:    Normocephalic, without obvious abnormality, atraumatic  ?Eyes:    PERRL, conjunctiva/corneas clear, EOM's intact, fundi  ?  benign, both eyes       ?Ears:    Normal TM's and external ear canals, both ears  ?Nose:   Nares normal, septum midline, mucosa normal, no drainage ?  or sinus tenderness  ?Throat:   Lips, mucosa, and tongue normal; teeth and gums normal  ?Neck:   Supple, symmetrical, trachea midline, no adenopathy;     ?  thyroid:  No enlargement/tenderness/nodules; no carotid ?  bruit or JVD  ?Back:     Symmetric, no curvature, ROM normal, no CVA tenderness  ?Lungs:     Clear to auscultation bilaterally, respirations unlabored  ?Chest wall:    No tenderness or deformity  ?Heart:    Normal heart rate. Normal rhythm. No murmurs, rubs, or gallops.  S1 and S2 normal  ?Abdomen:     Soft, non-tender, bowel sounds active all four quadrants,  ?  no masses, no organomegaly  ?Genitalia:    deferred  ?Rectal:    deferred  ?Extremities:   All extremities are intact. No cyanosis or edema  ?Pulses:   2+ and symmetric all extremities  ?Skin:   Skin color, texture, turgor normal, no rashes or lesions  ?Lymph nodes:   Cervical, supraclavicular, and axillary nodes normal  ?Neurologic:   CNII-XII intact. Normal strength, sensation and reflexes    ?  throughout  ?  ? ?Last  depression screening scores ? ?  11/11/2021  ?  2:13 PM 11/09/2017  ?  8:40 AM 11/06/2016  ?  8:52 AM  ?PHQ 2/9 Scores  ?PHQ - 2 Score 0 0 0  ?PHQ- 9 Score 0 0 0  ? ?Last fall risk screening ? ?  11/11/2021  ?  2:13 PM  ?Fall Risk   ?Falls in the past year? 0  ?Number falls in past yr: 0  ?Injury with Fall? 0  ?Risk for fall due to : No Fall Risks  ?Follow  up Falls evaluation completed  ? ?Last Audit-C alcohol use screening ?   ? View : No data to display.  ?  ?  ?  ? ?A score of 3 or more in women, and 4 or more in men indicates increased risk for alcohol abuse, EXCEPT if all of the points are from question 1  ? ?No results found for any visits on 11/11/21. ? Assessment & Plan  ?  ?Routine Health Maintenance and Physical Exam ? ?Exercise Activities and Dietary recommendations ? Goals   ?None ?  ? ? ?Immunization History  ?Administered Date(s) Administered  ? Influenza-Unspecified 03/17/2018  ? Tdap 10/20/2015  ? Zoster Recombinat (Shingrix) 01/10/2018  ? Zoster, Live 11/14/2012  ? ? ?Health Maintenance  ?Topic Date Due  ? Hepatitis C Screening  Never done  ? Zoster Vaccines- Shingrix (2 of 2) 03/07/2018  ? INFLUENZA VACCINE  02/14/2022  ? COLONOSCOPY (Pts 45-71yrs Insurance coverage will need to be confirmed)  01/31/2023  ? TETANUS/TDAP  10/19/2025  ? HIV Screening  Completed  ? HPV VACCINES  Aged Out  ? ? ?Discussed health benefits of physical activity, and encouraged him to engage in regular exercise appropriate for his age and condition. ? ? ?2. Abnormal glucose level ?A1c improved to 5.6 this year.  ? ?3. Avitaminosis D ?Doing well current d supplementation.  ? ?4. Mixed hyperlipidemia ?Doing well current diet and OTC Red Yeast Rice Extract ? ?  ? ?The entirety of the information documented in the History of Present Illness, Review of Systems and Physical Exam were personally obtained by me. Portions of this information were initially documented by the CMA and reviewed by me for thoroughness and accuracy.    ? ? ?Christopher Huh, MD  ?[facility] ?[phone] (phone) ?[phone] (fax) ? ?[facility]  ?

## 2021-11-29 ENCOUNTER — Other Ambulatory Visit: Payer: Self-pay | Admitting: Urology

## 2021-11-29 DIAGNOSIS — R972 Elevated prostate specific antigen [PSA]: Secondary | ICD-10-CM

## 2021-12-13 ENCOUNTER — Ambulatory Visit
Admission: RE | Admit: 2021-12-13 | Discharge: 2021-12-13 | Disposition: A | Discharge status: Home / Self Care | Payer: BC Managed Care – PPO | Source: Ambulatory Visit | Attending: Urology | Admitting: Urology

## 2021-12-13 DIAGNOSIS — R590 Localized enlarged lymph nodes: Secondary | ICD-10-CM | POA: Diagnosis not present

## 2021-12-13 DIAGNOSIS — N433 Hydrocele, unspecified: Secondary | ICD-10-CM | POA: Diagnosis not present

## 2021-12-13 DIAGNOSIS — R972 Elevated prostate specific antigen [PSA]: Secondary | ICD-10-CM | POA: Insufficient documentation

## 2021-12-13 MED ORDER — GADOBUTROL 1 MMOL/ML IV SOLN
7.5000 mL | Freq: Once | INTRAVENOUS | Status: AC | PRN
  Administered 2021-12-13: 7.5 mL via INTRAVENOUS

## 2021-12-19 DIAGNOSIS — N401 Enlarged prostate with lower urinary tract symptoms: Secondary | ICD-10-CM | POA: Diagnosis not present

## 2021-12-19 DIAGNOSIS — R972 Elevated prostate specific antigen [PSA]: Secondary | ICD-10-CM | POA: Diagnosis not present

## 2022-05-08 ENCOUNTER — Telehealth: Payer: Self-pay | Admitting: Family Medicine

## 2022-05-08 NOTE — Telephone Encounter (Signed)
Pt called in for assistance. Pt says that he received a bill for DOS: 11/11/21  Pt says that he didn't have labs drawn and is unsure of why he received such high bill.    Pt would like further assistance with this.    It is showing a billing code [place]: [phone] - please advise pt further.

## 2022-05-19 DIAGNOSIS — J019 Acute sinusitis, unspecified: Secondary | ICD-10-CM | POA: Diagnosis not present

## 2022-05-19 NOTE — Telephone Encounter (Signed)
Called pt and he stated all was figured out.  No need for follow-up.

## 2022-06-19 DIAGNOSIS — R972 Elevated prostate specific antigen [PSA]: Secondary | ICD-10-CM | POA: Diagnosis not present

## 2022-06-19 DIAGNOSIS — N401 Enlarged prostate with lower urinary tract symptoms: Secondary | ICD-10-CM | POA: Diagnosis not present

## 2022-06-19 DIAGNOSIS — Z79899 Other long term (current) drug therapy: Secondary | ICD-10-CM | POA: Diagnosis not present

## 2022-07-07 DIAGNOSIS — H9201 Otalgia, right ear: Secondary | ICD-10-CM | POA: Diagnosis not present

## 2022-10-19 LAB — BASIC METABOLIC PANEL
BUN: 12 (ref 4–21)
Chloride: 102 (ref 99–108)
Creatinine: 1.1 (ref 0.6–1.3)
Glucose: 96
Potassium: 4.3 mEq/L (ref 3.5–5.1)
Sodium: 139 (ref 137–147)

## 2022-10-19 LAB — COMPREHENSIVE METABOLIC PANEL
Albumin: 4.5 (ref 3.5–5.0)
Calcium: 9.5 (ref 8.7–10.7)
Globulin: 2.2
eGFR: 76

## 2022-10-19 LAB — CBC AND DIFFERENTIAL
HCT: 47 (ref 41–53)
Hemoglobin: 15.8 (ref 13.5–17.5)
Platelets: 208 10*3/uL (ref 150–400)
WBC: 3

## 2022-10-19 LAB — LIPID PANEL
Cholesterol: 252 — AB (ref 0–200)
HDL: 42 (ref 35–70)
LDL Cholesterol: 192
Triglycerides: 102 (ref 40–160)

## 2022-10-19 LAB — HEPATIC FUNCTION PANEL
ALT: 19 U/L (ref 10–40)
AST: 34 (ref 14–40)
Alkaline Phosphatase: 76 (ref 25–125)
Bilirubin, Total: 0.5

## 2022-10-19 LAB — HEMOGLOBIN A1C: Hemoglobin A1C: 5.8

## 2022-10-19 LAB — TSH: TSH: 2.99 (ref 0.41–5.90)

## 2022-10-19 LAB — VITAMIN D 25 HYDROXY (VIT D DEFICIENCY, FRACTURES): Vit D, 25-Hydroxy: 36.7

## 2022-10-20 ENCOUNTER — Encounter: Payer: Self-pay | Admitting: Family Medicine

## 2022-10-24 ENCOUNTER — Encounter: Payer: Self-pay | Admitting: Family Medicine

## 2022-11-13 NOTE — Progress Notes (Unsigned)
Christopher Cuevas,acting as a scribe for Christopher Merry, MD.,have documented all relevant documentation on the behalf of Christopher Merry, MD,as directed by  Christopher Merry, MD    Complete physical exam   Patient: Christopher Cuevas.   DOB: [birth year]   62 y.o. Male  MRN: [id number] Visit Date: 11/14/2022  Today's healthcare provider: [name], MD   No chief complaint on file.  Subjective    Christopher Cuevas. is a 62 y.o. male who presents today for a complete physical exam.  He reports consuming a general diet.  He generally feels well. He reports sleeping fairly well. He does not have additional problems to discuss today.   He is noted to have elevated cholesterol on labs done at work earlier this month. He states he eats a lot of fish and check-in, but had also been eating a lot of wings in March with the basketball tournament going on.  Lab Results  Component Value Date   CHOL 252 (A) 10/19/2022   HDL 42 10/19/2022   LDLCALC 192 10/19/2022   TRIG 102 10/19/2022   CHOLHDL 5.2 04/17/2019   Last vitamin D Lab Results  Component Value Date   VD25OH 36.7 10/19/2022   Lab Results  Component Value Date   PSA 3.3 04/18/2019   PSA 4.7 10/30/2017   PSA 3.3 10/24/2016   Last metabolic panel Lab Results  Component Value Date   NA 139 10/19/2022   K 4.3 10/19/2022   CL 102 10/19/2022   BUN 12 10/19/2022   CREATININE 1.1 10/19/2022   EGFR 76 10/19/2022   CALCIUM 9.5 10/19/2022   PHOS 3.2 04/17/2019   PROT 7.0 04/17/2019   ALBUMIN 4.5 10/19/2022   LABGLOB 2.5 04/17/2019   AGRATIO 1.8 04/17/2019   ALKPHOS 76 10/19/2022   AST 34 10/19/2022   ALT 19 10/19/2022     No past medical history on file. Past Surgical History:  Procedure Laterality Date   NO PAST SURGERIES     Social History   Socioeconomic History   Marital status: Single    Spouse name: Not on file   Number of children: Not on file   Years of education: Not on file   Highest education level:  Not on file  Occupational History   Not on file  Tobacco Use   Smoking status: Never   Smokeless tobacco: Never  Substance and Sexual Activity   Alcohol use: Yes    Comment: 1-2 glasses of wine per 2 weeks   Drug use: No   Sexual activity: Not on file  Other Topics Concern   Not on file  Social History Narrative   Not on file   Social Determinants of Health   Financial Resource Strain: Not on file  Food Insecurity: No Food Insecurity (11/14/2022)   Hunger Vital Sign    Worried About Running Out of Food in the Last Year: Never true    Ran Out of Food in the Last Year: Never true  Transportation Needs: No Transportation Needs (11/14/2022)   PRAPARE - Administrator, Civil Service (Medical): No    Lack of Transportation (Non-Medical): No  Physical Activity: Not on file  Stress: Not on file  Social Connections: Not on file  Intimate Partner Violence: Not At Risk (11/14/2022)   Humiliation, Afraid, Rape, and Kick questionnaire    Fear of Current or Ex-Partner: No    Emotionally Abused: No    Physically Abused: No  Sexually Abused: No   Family Status  Relation Name Status   Mother  Deceased at age 38   Father  Deceased at age 27   Sister  Alive   Brother  Alive   MGM  Deceased   MGF  Deceased   PGM  Deceased   Sister  Alive   Sister  Alive   Sister  Alive   Family History  Problem Relation Age of Onset   Asthma Mother    Stomach cancer Maternal Grandmother    No Known Allergies  Patient Care Team: Chrismon, Jodell Cipro, PA-C (Inactive) as PCP - General (Family Medicine)   Medications: Outpatient Medications Prior to Visit  Medication Sig   Cholecalciferol (VITAMIN D3) 250 MCG (10000 UT) TABS Take 1 tablet by mouth daily. M-F   finasteride (PROSCAR) 5 MG tablet Take 5 mg by mouth daily.   Krill Oil (OMEGA-3) 500 MG CAPS Take 1 capsule by mouth daily. M-F   PSYLLIUM HUSK PO Take 5 capsules by mouth daily. M-F   Red Yeast Rice 600 MG CAPS Take 2  capsules by mouth daily. M-F   No facility-administered medications prior to visit.    Review of Systems  Constitutional: Negative.   HENT:  Positive for ear pain (right).   Eyes: Negative.   Respiratory: Negative.    Cardiovascular: Negative.   Gastrointestinal: Negative.   Endocrine: Negative.   Genitourinary: Negative.   Musculoskeletal: Negative.   Skin: Negative.   Allergic/Immunologic: Negative.   Neurological: Negative.   Hematological: Negative.   Psychiatric/Behavioral: Negative.        Objective    BP (!) 149/79 (BP Location: Right Arm, Patient Position: Sitting, Cuff Size: Large)   Pulse 65   Temp 98.9 F (37.2 C) (Oral)   Ht 6' (1.829 m)   Wt 192 lb (87.1 kg)   SpO2 99%   BMI 26.04 kg/m     Physical Exam   General Appearance:    Well developed, well nourished male. Alert, cooperative, in no acute distress, appears stated age  Head:    Normocephalic, without obvious abnormality, atraumatic  Eyes:    PERRL, conjunctiva/corneas clear, EOM's intact, fundi    benign, both eyes       Ears:    Normal TM's and external ear canals, both ears  Nose:   Nares normal, septum midline, mucosa normal, no drainage   or sinus tenderness  Throat:   Lips, mucosa, and tongue normal; teeth and gums normal  Neck:   Supple, symmetrical, trachea midline, no adenopathy;       thyroid:  No enlargement/tenderness/nodules; no carotid   bruit or JVD  Back:     Symmetric, no curvature, ROM normal, no CVA tenderness  Lungs:     Clear to auscultation bilaterally, respirations unlabored  Chest wall:    No tenderness or deformity  Heart:    Normal heart rate. Normal rhythm. No murmurs, rubs, or gallops.  S1 and S2 normal  Abdomen:     Soft, non-tender, bowel sounds active all four quadrants,    no masses, no organomegaly  Genitalia:    deferred  Rectal:    deferred  Extremities:   All extremities are intact. No cyanosis or edema  Pulses:   2+ and symmetric all extremities  Skin:    Skin color, texture, turgor normal, no rashes or lesions  Lymph nodes:   Cervical, supraclavicular, and axillary nodes normal  Neurologic:   CNII-XII intact. Normal strength, sensation and  reflexes      throughout     Last depression screening scores    11/14/2022    9:15 AM 11/11/2021    2:13 PM 11/09/2017    8:40 AM  PHQ 2/9 Scores  PHQ - 2 Score 0 0 0  PHQ- 9 Score 0 0 0   Last fall risk screening    11/14/2022    9:15 AM  Fall Risk   Falls in the past year? 0  Number falls in past yr: 0  Injury with Fall? 0   Last Audit-C alcohol use screening    11/14/2022    9:14 AM  Alcohol Use Disorder Test (AUDIT)  1. How often do you have a drink containing alcohol? 3  2. How many drinks containing alcohol do you have on a typical day when you are drinking? 0  3. How often do you have six or more drinks on one occasion? 0  AUDIT-C Score 3   A score of 3 or more in women, and 4 or more in men indicates increased risk for alcohol abuse, EXCEPT if all of the points are from question 1   No results found for any visits on 11/14/22.  Assessment & Plan    Routine Health Maintenance and Physical Exam  Exercise Activities and Dietary recommendations  Goals   None     Immunization History  Administered Date(s) Administered   Influenza-Unspecified 03/17/2018   Moderna Sars-Covid-2 Vaccination 10/24/2019, 11/20/2019   Tdap 10/20/2015   Zoster Recombinat (Shingrix) 01/10/2018   Zoster, Live 11/14/2012    Health Maintenance  Topic Date Due   HIV Screening  Never done   Hepatitis C Screening  Never done   Zoster Vaccines- Shingrix (2 of 2) 03/07/2018   COVID-19 Vaccine (3 - 2023-24 season) 03/17/2022   COLONOSCOPY (Pts 45-75yrs Insurance coverage will need to be confirmed)  01/31/2023   INFLUENZA VACCINE  02/15/2023   DTaP/Tdap/Td (2 - Td or Tdap) 10/19/2025   HPV VACCINES  Aged Out    Discussed health benefits of physical activity, and encouraged him to engage in regular  exercise appropriate for his age and condition.  1. Annual physical exam  - EKG 12-Lead He is going to check with his workplace regarding coverage for colonoscopy or Cologuard for colon cancer screening.   2. Mixed hyperlipidemia The 10-year ASCVD risk score (Arnett DK, et al., 2019) is: 12%* (Cholesterol units were assumed)   Recent LDL unusually elevated likely due to increase consumption of wings in March. Counseled on avoiding red meats and other foods high in saturated fats.    No family history of CAD. Consider statin if remains elevated with next blood draw.   3. Avitaminosis D Normalized.   4. Need for shingles vaccine He states he had second dose of Shingrix arranged through work a few years ago. He is going to try to get records.     The entirety of the information documented in the History of Present Illness, Review of Systems and Physical Exam were personally obtained by me. Portions of this information were initially documented by the CMA and reviewed by me for thoroughness and accuracy.     Christopher Merry, MD  [facility] [phone] (phone) [phone] (fax)  [facility]

## 2022-11-14 ENCOUNTER — Ambulatory Visit (INDEPENDENT_AMBULATORY_CARE_PROVIDER_SITE_OTHER): Payer: BC Managed Care – PPO | Admitting: Family Medicine

## 2022-11-14 VITALS — BP 149/79 | HR 65 | Temp 98.9°F | Ht 72.0 in | Wt 192.0 lb

## 2022-11-14 DIAGNOSIS — E559 Vitamin D deficiency, unspecified: Secondary | ICD-10-CM

## 2022-11-14 DIAGNOSIS — E782 Mixed hyperlipidemia: Secondary | ICD-10-CM

## 2022-11-14 DIAGNOSIS — Z23 Encounter for immunization: Secondary | ICD-10-CM | POA: Diagnosis not present

## 2022-11-14 DIAGNOSIS — Z0000 Encounter for general adult medical examination without abnormal findings: Secondary | ICD-10-CM | POA: Diagnosis not present

## 2022-11-14 NOTE — Patient Instructions (Addendum)
Please review the attached list of medications and notify my office if there are any errors.   You are due for colon cancer screening. Currently recommendations are to have a colonoscopy every 10 years or Cologuard stool  test every 3 years. I suggest checking with insurance to see if this screening test is covered.   We do not have a record of the second dose of your Shingles vaccine. Please send [place] a copy of the records, or come back for a Shingles vaccine if you haven't had the second dose.

## 2022-11-20 ENCOUNTER — Encounter: Payer: Self-pay | Admitting: Family Medicine

## 2023-01-17 ENCOUNTER — Telehealth: Payer: Self-pay

## 2023-01-17 DIAGNOSIS — Z1211 Encounter for screening for malignant neoplasm of colon: Secondary | ICD-10-CM

## 2023-01-17 NOTE — Telephone Encounter (Signed)
Copied from CRM [id number]. Topic: General - Other >> Jan 17, 2023 12:40 PM Ja-Kwan M wrote: Reason for CRM: Pt would like to request an order for cologuard test. Cb# [phone]

## 2023-01-18 NOTE — Telephone Encounter (Signed)
Have entered order.  

## 2023-01-18 NOTE — Addendum Note (Signed)
Addended by: Malva Limes on: 01/18/2023 02:55 PM   Modules accepted: Orders

## 2023-01-29 DIAGNOSIS — Z1211 Encounter for screening for malignant neoplasm of colon: Secondary | ICD-10-CM | POA: Diagnosis not present

## 2023-02-05 LAB — COLOGUARD: COLOGUARD: NEGATIVE

## 2023-07-06 ENCOUNTER — Encounter: Payer: Self-pay | Admitting: Nurse Practitioner

## 2023-07-06 ENCOUNTER — Ambulatory Visit: Payer: BC Managed Care – PPO | Admitting: Nurse Practitioner

## 2023-07-06 ENCOUNTER — Ambulatory Visit: Payer: Self-pay

## 2023-07-06 VITALS — BP 196/84 | HR 89 | Temp 97.8°F | Resp 14 | Ht 72.0 in | Wt 193.7 lb

## 2023-07-06 DIAGNOSIS — I10 Essential (primary) hypertension: Secondary | ICD-10-CM | POA: Diagnosis not present

## 2023-07-06 HISTORY — DX: Essential (primary) hypertension: I10

## 2023-07-06 MED ORDER — HYDROCHLOROTHIAZIDE 12.5 MG PO TABS: 12.5000 mg | ORAL_TABLET | Freq: Every day | ORAL | 0 refills | Status: DC

## 2023-07-06 NOTE — Telephone Encounter (Signed)
Chief Complaint: High BP reading 188/92 Symptoms: "earache type pain" right temple area, under stress Frequency: Onset stress x 1 week Pertinent Negatives: Patient denies other symptoms Disposition: [] ED /[] Urgent Care (no appt availability in office) / [x] Appointment(In office/virtual)/ []  [facility]/ [] Home Care/ [] Refused Recommended Disposition /[] [facility]/ []  Follow-up with PCP Additional Notes: Patient reports being under stress and high BP reading at his job and the nurse told him to call his PCP. BP while on the phone with triage nurse 188/92. Advised OV today, no availability with PCP or any provider at [facility], offered float office [facility], he agrees. Scheduled today with Della Goo, NP.   Reason for Disposition  Systolic BP  >= 180 OR Diastolic >= 110  Answer Assessment - Initial Assessment Questions 1. BLOOD PRESSURE: "What is the blood pressure?" "Did you take at least two measurements 5 minutes apart?"     188/92 2. ONSET: "When did you take your blood pressure?"     Just now while on the phone with the nurse 3. HOW: "How did you take your blood pressure?" (e.g., automatic home BP monitor, visiting nurse)     Automatic BP cuff upper arm 4. HISTORY: "Do you have a history of high blood pressure?"     No 5. MEDICINES: "Are you taking any medicines for blood pressure?" "Have you missed any doses recently?"     No 6. OTHER SYMPTOMS: "Do you have any symptoms?" (e.g., blurred vision, chest pain, difficulty breathing, headache, weakness)     Stress due to personal problems with girlfriend, feels like a "earache type pain" right above the ear on the right side temple area x 1 week  Protocols used: Blood Pressure - High-A-AH

## 2023-07-06 NOTE — Progress Notes (Signed)
BP (!) 196/84 (BP Location: Left Arm, Patient Position: Sitting, Cuff Size: Large)   Pulse 89   Temp 97.8 F (36.6 C) (Oral)   Resp 14   Ht 6' (1.829 m) Comment: per chart  Wt 193 lb 11.2 oz (87.9 kg)   SpO2 98%   BMI 26.27 kg/m    Subjective:    Patient ID: Christopher Cuevas., male    DOB: 1961/04/14, 62 y.o.   MRN: [id number]  HPI: Christopher Cuevas. is a 62 y.o. male  Chief Complaint  Patient presents with   Hypertension    Discussed the use of AI scribe software for clinical note transcription with the patient, who gave verbal consent to proceed.  History of Present Illness   The patient, with no known history of hypertension, presents with elevated blood pressure readings at work and at home. He attributes the recent onset of hypertension to stress related to personal relationships. He denies any history of hypertension or use of antihypertensive medications. He reports a throbbing sensation in his head but denies  blurred vision, chest pain, or shortness of breath. He also denies any history of mood disorders or use of anxiolytic medications. The patient's blood pressure has been noted to be slightly elevated in the past, but not as high as the current readings. He is open to starting antihypertensive medication to manage his blood pressure.       07/06/2023    2:39 PM 07/06/2023    2:34 PM 11/14/2022    9:15 AM  Depression screen PHQ 2/9  Decreased Interest 0 0 0  Down, Depressed, Hopeless 0 0 0  PHQ - 2 Score 0 0 0  Altered sleeping 0  0  Tired, decreased energy 0  0  Change in appetite 0  0  Feeling bad or failure about yourself  0  0  Trouble concentrating 0  0  Moving slowly or fidgety/restless 0  0  Suicidal thoughts 0  0  PHQ-9 Score 0  0  Difficult doing work/chores Not difficult at all  Not difficult at all       07/06/2023    2:38 PM  GAD 7 : Generalized Anxiety Score  Nervous, Anxious, on Edge 0  Control/stop worrying 1  Worry too much -  different things 0  Trouble relaxing 0  Restless 0  Easily annoyed or irritable 1  Afraid - awful might happen 0  Total GAD 7 Score 2  Anxiety Difficulty Not difficult at all     Relevant past medical, surgical, family and social history reviewed and updated as indicated. Interim medical history since our last visit reviewed. Allergies and medications reviewed and updated.  Review of Systems  Constitutional: Negative for fever or weight change.  Respiratory: Negative for cough and shortness of breath.   Cardiovascular: Negative for chest pain or palpitations.  Gastrointestinal: Negative for abdominal pain, no bowel changes.  Musculoskeletal: Negative for gait problem or joint swelling.  Skin: Negative for rash.  Neurological: Negative for dizziness or headache.  No other specific complaints in a complete review of systems (except as listed in HPI above).      Objective:    BP (!) 196/84 (BP Location: Left Arm, Patient Position: Sitting, Cuff Size: Large)   Pulse 89   Temp 97.8 F (36.6 C) (Oral)   Resp 14   Ht 6' (1.829 m) Comment: per chart  Wt 193 lb 11.2 oz (87.9 kg)   SpO2 98%  BMI 26.27 kg/m   BP Readings from Last 3 Encounters:  07/06/23 (!) 196/84  11/14/22 (!) 149/79  11/11/21 139/81     Wt Readings from Last 3 Encounters:  07/06/23 193 lb 11.2 oz (87.9 kg)  11/14/22 192 lb (87.1 kg)  11/11/21 194 lb (88 kg)    Physical Exam  Constitutional: Patient appears well-developed and well-nourished.  No distress.  HEENT: head atraumatic, normocephalic, pupils equal and reactive to light, neck supple Cardiovascular: Normal rate, regular rhythm and normal heart sounds.  No murmur heard. No BLE edema. Pulmonary/Chest: Effort normal and breath sounds normal. No respiratory distress. Abdominal: Soft.  There is no tenderness. Psychiatric: Patient has a normal mood and affect. behavior is normal. Judgment and thought content normal.  Results for orders placed or  performed in visit on 01/17/23  Cologuard   Collection Time: 01/29/23  6:25 AM  Result Value Ref Range   COLOGUARD Negative Negative   Last metabolic panel Lab Results  Component Value Date   NA 139 10/19/2022   K 4.3 10/19/2022   CL 102 10/19/2022   BUN 12 10/19/2022   CREATININE 1.1 10/19/2022   EGFR 76 10/19/2022   CALCIUM 9.5 10/19/2022   PHOS 3.2 04/17/2019   PROT 7.0 04/17/2019   ALBUMIN 4.5 10/19/2022   LABGLOB 2.5 04/17/2019   AGRATIO 1.8 04/17/2019   ALKPHOS 76 10/19/2022   AST 34 10/19/2022   ALT 19 10/19/2022        Assessment & Plan:   Problem List Items Addressed This Visit   None Visit Diagnoses       Primary hypertension    -  Primary   Relevant Medications   hydrochlorothiazide (HYDRODIURIL) 12.5 MG tablet        Assessment and Plan    Hypertension Elevated blood pressure readings in the office and at work. Patient reports stress as a contributing factor. No history of antihypertensive medication use. No symptoms of hypertensive urgency or emergency. -Start Hydrochlorothiazide as per patient's preference. -Check blood pressure and keep log, follow up with Dr. Sherrie Mustache (PCP) -Send prescription to [facility] on [address].  Stress Patient reports stress related to personal relationships. No significant findings on anxiety screening questionnaire. -Encourage stress management techniques. -Continue to monitor for symptoms of anxiety.        Follow up plan: Return in about 1 week (around 07/13/2023) for follow up, Dr. Sherrie Mustache.

## 2023-07-28 ENCOUNTER — Other Ambulatory Visit: Payer: Self-pay | Admitting: Nurse Practitioner

## 2023-07-28 DIAGNOSIS — I10 Essential (primary) hypertension: Secondary | ICD-10-CM

## 2023-07-31 NOTE — Telephone Encounter (Signed)
 Requested medication (s) are due for refill today: yes  Requested medication (s) are on the active medication list: yes  Last refill:  07/06/23 #30 0 refills  Future visit scheduled: no  Notes to clinic:  protocol failed last labs 10/19/22. Last ordered by J. Pender,FNP.   Pharmacy comment: REQUEST FOR 90 DAYS PRESCRIPTION. DX Code Needed.   Do you want to refill Rx?     Requested Prescriptions  Pending Prescriptions Disp Refills   hydrochlorothiazide  (HYDRODIURIL ) 12.5 MG tablet [Pharmacy Med Name: HYDROCHLOROTHIAZIDE  12.5 MG TB] 90 tablet 1    Sig: TAKE 1 TABLET BY MOUTH EVERY DAY     Cardiovascular: Diuretics - Thiazide Failed - 07/31/2023  8:43 AM      Failed - Cr in normal range and within 180 days    Creatinine  Date Value Ref Range Status  10/19/2022 1.1 0.6 - 1.3 Final         Failed - K in normal range and within 180 days    Potassium  Date Value Ref Range Status  10/19/2022 4.3 3.5 - 5.1 mEq/L Final         Failed - Na in normal range and within 180 days    Sodium  Date Value Ref Range Status  10/19/2022 139 137 - 147 Final         Failed - Last BP in normal range    BP Readings from Last 1 Encounters:  07/06/23 (!) 196/84         Passed - Valid encounter within last 6 months    Recent Outpatient Visits           3 weeks ago Primary hypertension   [facility] Gareth Mliss FALCON, FNP   8 months ago Annual physical exam   [facility] Nancyann BRAVO, [place]   1 year ago Annual physical exam   [facility] Gas[facility] Nancyann BRAVO, [place]   5 years ago Annual physical exam   [facility] Chrismon, Marinda BRAVO, [place]   6 years ago Annual physical exam   [facility] Chrismon, Marinda BRAVO, [place]
